# Patient Record
Sex: Male | Born: 1950 | Race: White | Hispanic: No | Marital: Married | Smoking: Current every day smoker
Health system: Southern US, Community
[De-identification: ages and names within clinical notes are randomized; demographics above are authoritative.]

## PROBLEM LIST (undated history)

## (undated) DIAGNOSIS — H9319 Tinnitus, unspecified ear: Secondary | ICD-10-CM

## (undated) DIAGNOSIS — K5900 Constipation, unspecified: Secondary | ICD-10-CM

## (undated) DIAGNOSIS — R251 Tremor, unspecified: Secondary | ICD-10-CM

## (undated) DIAGNOSIS — R531 Weakness: Secondary | ICD-10-CM

## (undated) DIAGNOSIS — R479 Unspecified speech disturbances: Secondary | ICD-10-CM

## (undated) DIAGNOSIS — M25569 Pain in unspecified knee: Secondary | ICD-10-CM

## (undated) DIAGNOSIS — R269 Unspecified abnormalities of gait and mobility: Secondary | ICD-10-CM

## (undated) HISTORY — DX: Pain in unspecified knee: M25.569

## (undated) HISTORY — DX: Tremor, unspecified: R25.1

## (undated) HISTORY — PX: REPLACEMENT TOTAL KNEE: SUR1224

## (undated) HISTORY — PX: COLONOSCOPY: SHX174

## (undated) HISTORY — DX: Unspecified speech disturbances: R47.9

## (undated) HISTORY — DX: Weakness: R53.1

## (undated) HISTORY — DX: Tinnitus, unspecified ear: H93.19

## (undated) HISTORY — DX: Constipation, unspecified: K59.00

## (undated) HISTORY — DX: Unspecified abnormalities of gait and mobility: R26.9

---

## 2020-09-13 ENCOUNTER — Ambulatory Visit: Payer: Medicare Other | Admitting: Neurology

## 2020-09-13 ENCOUNTER — Telehealth: Payer: Self-pay | Admitting: Neurology

## 2020-09-13 ENCOUNTER — Encounter: Payer: Self-pay | Admitting: Neurology

## 2020-09-13 VITALS — BP 128/68 | HR 76 | Ht 70.0 in | Wt 207.5 lb

## 2020-09-13 DIAGNOSIS — R251 Tremor, unspecified: Secondary | ICD-10-CM

## 2020-09-13 DIAGNOSIS — R269 Unspecified abnormalities of gait and mobility: Secondary | ICD-10-CM | POA: Diagnosis not present

## 2020-09-13 NOTE — Telephone Encounter (Signed)
[employer] medicare order sent to GI. No auth they will reach out to the patient to schedule.  

## 2020-09-13 NOTE — Progress Notes (Signed)
Chief Complaint  Patient presents with  . New Patient (Initial Visit)    He is here with his wife, Robert Heath. Reports right hand tremor, decreased grip strength, loss of muscle control, changes in his gait, speech problems and fatigue. He tried propranol 40mg , one BID without much change in tremor. He also tried on Sinemet 25-100mg , one tablet BID and did not notice much change. His symptoms started in September of 2020. States he was in a normal state of health prior to that time.   02-09-1969, Robert Heath, [place] - referring provider  . PCP    He goes to [facility] in [place], [place] for his primary care needs.  . Neurology    Previous seen by Dr. [place]    HISTORICAL  Robert Heath is a 69 year old male, seen in request by her chiropractor 74, for evaluation of tremor, initial evaluation was on September 13, 2020.  I reviewed and summarized the referring note.   He retired from September 15, 2020 at age 41, had left knee replacement surgery on June 06, 2019, recovered well  His wife reported gradual onset change since 2020, decline in functional status, gait abnormality, right hand tremor, he now rely on people's help to put on clothes and shoes, continue to decline rapidly, also has intermittent confusion, temper outbursts.  He was seen outside physician for right hand action tremor, was giving a trial of Sinemet 25/100 mg twice daily and propanolol 40 mg twice daily without benefit.   REVIEW OF SYSTEMS: Full 14 system review of systems performed and notable only for as above All other review of systems were negative.  ALLERGIES: Allergies  Allergen Reactions  . Codeine Other (See Comments)    GI upset    HOME MEDICATIONS: No current outpatient medications on file.   No current facility-administered medications for this visit.    PAST MEDICAL HISTORY: Past Medical History:  Diagnosis Date  . Constipation   . Gait abnormality   . Knee pain   .  Speech abnormality   . Tinnitus   . Tremor   . Weakness     PAST SURGICAL HISTORY: Past Surgical History:  Procedure Laterality Date  . COLONOSCOPY    . REPLACEMENT TOTAL KNEE Left     FAMILY HISTORY: Family History  Problem Relation Age of Onset  . Lung cancer Mother   . Other Father        died from COVID-19  . Hypertension Father   . Prostate cancer Father     SOCIAL HISTORY: Social History   Socioeconomic History  . Marital status: Married    Spouse name: Not on file  . Number of children: 2  . Years of education: 47  . Highest education level: High school graduate  Occupational History  . Occupation: Retired 14     Comment: [employer]  Tobacco Use  . Smoking status: Current Every Day Smoker    Packs/day: 0.33    Types: Cigarettes  . Smokeless tobacco: Never Used  Substance and Sexual Activity  . Alcohol use: Not Currently  . Drug use: Never  . Sexual activity: Not on file  Other Topics Concern  . Not on file  Social History Narrative   Lives at home with his wife.   Right-handed.   Caffeine use: 3-4 cups of tea per day.   Social Determinants of Health   Financial Resource Strain:   . Difficulty of Paying Living Expenses: Not  on file  Food Insecurity:   . Worried About Programme researcher, broadcasting/film/video in the Last Year: Not on file  . Ran Out of Food in the Last Year: Not on file  Transportation Needs:   . Lack of Transportation (Medical): Not on file  . Lack of Transportation (Non-Medical): Not on file  Physical Activity:   . Days of Exercise per Week: Not on file  . Minutes of Exercise per Session: Not on file  Stress:   . Feeling of Stress : Not on file  Social Connections:   . Frequency of Communication with Friends and Family: Not on file  . Frequency of Social Gatherings with Friends and Family: Not on file  . Attends Religious Services: Not on file  . Active Member of Clubs or Organizations: Not on file  . Attends Tax inspector Meetings: Not on file  . Marital Status: Not on file  Intimate Partner Violence:   . Fear of Current or Ex-Partner: Not on file  . Emotionally Abused: Not on file  . Physically Abused: Not on file  . Sexually Abused: Not on file     PHYSICAL EXAM   Vitals:   09/13/20 1426  BP: 128/68  Pulse: 76  Weight: 207 lb 8 oz (94.1 kg)  Height: 5\' 10"  (1.778 m)   Not recorded     Body mass index is 29.77 kg/m.  PHYSICAL EXAMNIATION:  Gen: NAD, conversant, well nourised, well groomed                     Cardiovascular: Regular rate rhythm, no peripheral edema, warm, nontender. Eyes: Conjunctivae clear without exudates or hemorrhage Neck: Supple, no carotid bruits. Pulmonary: Clear to auscultation bilaterally   NEUROLOGICAL EXAM:  MENTAL STATUS: MMSE - Mini Mental State Exam 09/13/2020  Orientation to time 5  Orientation to Place 3  Registration 3  Attention/ Calculation 0  Recall 0  Language- name 2 objects 2  Language- repeat 1  Language- follow 3 step command 3  Language- read & follow direction 1  Write a sentence 0  Copy design 0  Total score 18   Confused, follows commands intermittently, mild slurred speech, difficulty following 3 steps command   CRANIAL NERVES: CN II: Visual fields are full to confrontation.  Small pupils  are round equal and briskly reactive to light. CN III, IV, VI: extraocular movement are normal. No ptosis. CN V: Facial sensation is intact to light touch CN VII: Face is symmetric with normal eye closure  CN VIII: Hearing is normal to causal conversation. CN IX, X: Phonation is normal. CN XI: Head turning and shoulder shrug are intact  MOTOR: Right arm intermittent action tremor, pronation drift, tends to hold right arm in elbow flexion, shoulder adduction, move left arm, bilateral lower extremity without much difficulty  REFLEXES: Reflexes are 2+ and symmetric at the biceps, triceps, knees, and ankles. Plantar responses are  extensor bilaterally  SENSORY: Intact to light touch   COORDINATION: Seems to have right finger-to-nose dysmetria, was not able to follow commands for heel-to-shin  GAIT/STANCE: Needs to push-up to get up from seated position, wide-based, unsteady  DIAGNOSTIC DATA (LABS, IMAGING, TESTING) - I reviewed patient records, labs, notes, testing and imaging myself where available.   ASSESSMENT AND PLAN  Robert Heath is a 69 y.o. male   Gradual onset rapid progressive decline in functional status, including right hand action tremor, gait abnormality, mental confusion,  Mini-Mental Status Examination 18 out  of 30 today  Also had significant gait abnormality, mental confusion, difficulty following three-step commands,  Localized to central nervous system,  Proceed with MRI of the brain, EEG,  Laboratory evaluations,  Return to clinic in 3 to 4 weeks  Levert Feinstein, M.D. Ph.D.  [facility] [address], [address] [place], [place] [address] Ph: [phone] Fax: [phone]  CC:  Madlyn Frankel, DC [address] [place],  [place] [address]

## 2020-09-14 LAB — COMPREHENSIVE METABOLIC PANEL
ALT: 12 IU/L (ref 0–44)
AST: 14 IU/L (ref 0–40)
Albumin/Globulin Ratio: 1.6 (ref 1.2–2.2)
Albumin: 4.5 g/dL (ref 3.8–4.8)
Alkaline Phosphatase: 63 IU/L (ref 44–121)
BUN/Creatinine Ratio: 12 (ref 10–24)
BUN: 15 mg/dL (ref 8–27)
Bilirubin Total: 0.2 mg/dL (ref 0.0–1.2)
CO2: 21 mmol/L (ref 20–29)
Calcium: 9.8 mg/dL (ref 8.6–10.2)
Chloride: 101 mmol/L (ref 96–106)
Creatinine, Ser: 1.21 mg/dL (ref 0.76–1.27)
GFR calc Af Amer: 71 mL/min/{1.73_m2} (ref >59)
GFR calc non Af Amer: 61 mL/min/{1.73_m2} (ref >59)
Globulin, Total: 2.8 g/dL (ref 1.5–4.5)
Glucose: 94 mg/dL (ref 65–99)
Potassium: 4.2 mmol/L (ref 3.5–5.2)
Sodium: 139 mmol/L (ref 134–144)
Total Protein: 7.3 g/dL (ref 6.0–8.5)

## 2020-09-14 LAB — CBC WITH DIFFERENTIAL/PLATELET
Basophils Absolute: 0 10*3/uL (ref 0.0–0.2)
Basos: 1 %
EOS (ABSOLUTE): 0.1 10*3/uL (ref 0.0–0.4)
Eos: 1 %
Hematocrit: 47.8 % (ref 37.5–51.0)
Hemoglobin: 15.7 g/dL (ref 13.0–17.7)
Immature Grans (Abs): 0 10*3/uL (ref 0.0–0.1)
Immature Granulocytes: 0 %
Lymphocytes Absolute: 2.7 10*3/uL (ref 0.7–3.1)
Lymphs: 32 %
MCH: 30 pg (ref 26.6–33.0)
MCHC: 32.8 g/dL (ref 31.5–35.7)
MCV: 91 fL (ref 79–97)
Monocytes Absolute: 0.6 10*3/uL (ref 0.1–0.9)
Monocytes: 7 %
Neutrophils Absolute: 4.9 10*3/uL (ref 1.4–7.0)
Neutrophils: 59 %
Platelets: 382 10*3/uL (ref 150–450)
RBC: 5.24 x10E6/uL (ref 4.14–5.80)
RDW: 12.8 % (ref 11.6–15.4)
WBC: 8.4 10*3/uL (ref 3.4–10.8)

## 2020-09-14 LAB — C-REACTIVE PROTEIN: CRP: 2 mg/L (ref 0–10)

## 2020-09-14 LAB — SEDIMENTATION RATE: Sed Rate: 5 mm/hr (ref 0–30)

## 2020-09-14 LAB — FOLATE: Folate: >20 ng/mL (ref >3.0)

## 2020-09-14 LAB — HGB A1C W/O EAG: Hgb A1c MFr Bld: 5.6 % (ref 4.8–5.6)

## 2020-09-14 LAB — VITAMIN B12: Vitamin B-12: 461 pg/mL (ref 232–1245)

## 2020-09-14 LAB — HIV ANTIBODY (ROUTINE TESTING W REFLEX): HIV Screen 4th Generation wRfx: NONREACTIVE

## 2020-09-14 LAB — VITAMIN D 25 HYDROXY (VIT D DEFICIENCY, FRACTURES): Vit D, 25-Hydroxy: 28.1 ng/mL — ABNORMAL LOW (ref 30.0–100.0)

## 2020-09-14 LAB — RPR: RPR Ser Ql: NONREACTIVE

## 2020-09-14 LAB — CK: Total CK: 56 U/L (ref 41–331)

## 2020-09-14 LAB — ANA W/REFLEX IF POSITIVE: Anti Nuclear Antibody (ANA): NEGATIVE

## 2020-09-14 LAB — TSH: TSH: 1.13 u[IU]/mL (ref 0.450–4.500)

## 2020-09-17 ENCOUNTER — Telehealth: Payer: Self-pay | Admitting: *Deleted

## 2020-09-17 NOTE — Telephone Encounter (Signed)
-----   Message from Levert Feinstein, MD sent at 09/14/2020  4:00 PM EDT ----- Please call patient the only abnormality on laboratory evaluation was mildly decreased vitamin D 28, she would benefit vitamin D3 supplements,  Rest of the laboratory evaluation showed no significant abnormalities.

## 2020-09-17 NOTE — Telephone Encounter (Signed)
I spoke to the patient and he verbalized understanding of the results. He is agreeable to start the recommended supplement.

## 2020-09-24 ENCOUNTER — Ambulatory Visit: Payer: Medicare Other | Admitting: Neurology

## 2020-09-24 DIAGNOSIS — R251 Tremor, unspecified: Secondary | ICD-10-CM | POA: Diagnosis not present

## 2020-09-24 DIAGNOSIS — R269 Unspecified abnormalities of gait and mobility: Secondary | ICD-10-CM

## 2020-09-27 ENCOUNTER — Telehealth: Payer: Self-pay | Admitting: *Deleted

## 2020-09-27 NOTE — Procedures (Signed)
   HISTORY: 69 year old male presented with tremor loss of muscle tone,  TECHNIQUE:  This is a routine 16 channel EEG recording with one channel devoted to a limited EKG recording.  It was performed during wakefulness, drowsiness and asleep.  Hyperventilation and photic stimulation were performed as activating procedures.  There are minimum muscle and movement artifact noted.  Upon maximum arousal, posterior dominant waking rhythm consistent of rhythmic alpha range activity, with frequency of 9 hz. Activities are symmetric over the bilateral posterior derivations and attenuated with eye opening.  Hyperventilation produced mild/moderate buildup with higher amplitude and the slower activities noted.  Photic stimulation did not alter the tracing.  During EEG recording, patient developed drowsiness and entered sleep, sleep EEG demonstrated architecture, there were frontal centrally dominant vertex waves and symmetric sleep spindles noted.  During EEG recording, there was no epileptiform discharge noted.  EKG demonstrate sinus rhythm, with heart rate of 72 bpm  CONCLUSION: This is a  normal awake and sleep EEG.  There is no electrodiagnostic evidence of epileptiform discharge.  Levert Feinstein, M.D. Ph.D.  [facility] [address] [place], [place] [address] Phone: [phone] Fax:      [phone]

## 2020-09-27 NOTE — Telephone Encounter (Signed)
CONCLUSION:  This is a normal awake and sleep EEG. There is no  electrodiagnostic evidence of epileptiform discharge.  Per vo by Dr. Terrace Arabia, please notify patient of his results.  I spoke to Robert Heath and he verbalized understanding of the findings.

## 2020-09-27 NOTE — Telephone Encounter (Signed)
-----   Message from Lilla Shook, RN sent at 09/27/2020  5:18 PM EDT -----  ----- Message ----- From: Levert Feinstein, MD Sent: 09/27/2020   4:38 PM EDT To: Levert Feinstein, MD

## 2020-09-28 ENCOUNTER — Other Ambulatory Visit: Payer: Medicare Other

## 2020-10-08 ENCOUNTER — Ambulatory Visit
Admission: RE | Admit: 2020-10-08 | Discharge: 2020-10-08 | Disposition: A | Discharge status: Home / Self Care | Payer: Medicare Other | Source: Ambulatory Visit | Attending: Neurology | Admitting: Neurology

## 2020-10-08 ENCOUNTER — Other Ambulatory Visit: Payer: Self-pay

## 2020-10-08 DIAGNOSIS — R251 Tremor, unspecified: Secondary | ICD-10-CM

## 2020-10-08 DIAGNOSIS — R269 Unspecified abnormalities of gait and mobility: Secondary | ICD-10-CM

## 2020-10-09 ENCOUNTER — Telehealth: Payer: Self-pay | Admitting: Neurology

## 2020-10-09 NOTE — Telephone Encounter (Signed)
  IMPRESSION: This MRI of the brain without contrast shows the following: 1.   Mild generalized cortical atrophy 2.   Few scattered T2/FLAIR hyperintense foci consistent with minimal chronic microvascular ischemic change.  None of the foci appear to be acute. 3.   No acute findings.  Please call patient, MRI of the brain showed generalized atrophy, mild supratentorium small vessel disease, there was no acute abnormalities

## 2020-10-09 NOTE — Telephone Encounter (Signed)
I spoke to the patient. He verbalized understanding of the MRI findings.  ?

## 2020-10-09 NOTE — Telephone Encounter (Signed)
Left message requesting a return call.

## 2020-10-10 ENCOUNTER — Telehealth: Payer: Self-pay | Admitting: Neurology

## 2020-10-10 NOTE — Telephone Encounter (Signed)
Wife(on DPR-Geist, vivian) states all test results came back unremarkable, wife asking now about next plan of care, please call

## 2020-10-10 NOTE — Telephone Encounter (Signed)
I returned the call to the patient's wife. He was evaluated on 09/13/20. Orders were placed for EEG, MRI brain and labs. All tests have been completed. She wanted to schedule a follow up to discuss further steps. She scheduled the visit on 11/12/20.

## 2020-11-12 ENCOUNTER — Ambulatory Visit: Payer: Medicare Other | Admitting: Neurology

## 2020-11-15 ENCOUNTER — Encounter: Payer: Self-pay | Admitting: Neurology

## 2020-11-15 ENCOUNTER — Ambulatory Visit
Admission: RE | Admit: 2020-11-15 | Discharge: 2020-11-15 | Disposition: A | Discharge status: Home / Self Care | Payer: Medicare Other | Source: Ambulatory Visit | Attending: Neurology | Admitting: Neurology

## 2020-11-15 ENCOUNTER — Other Ambulatory Visit: Payer: Self-pay | Admitting: Neurology

## 2020-11-15 ENCOUNTER — Ambulatory Visit: Payer: Medicare Other | Admitting: Neurology

## 2020-11-15 ENCOUNTER — Other Ambulatory Visit: Payer: Self-pay

## 2020-11-15 VITALS — BP 118/74 | HR 85 | Ht 70.0 in | Wt 210.5 lb

## 2020-11-15 DIAGNOSIS — R410 Disorientation, unspecified: Secondary | ICD-10-CM

## 2020-11-15 DIAGNOSIS — R269 Unspecified abnormalities of gait and mobility: Secondary | ICD-10-CM

## 2020-11-15 DIAGNOSIS — R251 Tremor, unspecified: Secondary | ICD-10-CM

## 2020-11-15 MED ORDER — CARBIDOPA-LEVODOPA 25-100 MG PO TABS: 1.0000 | ORAL_TABLET | Freq: Three times a day (TID) | ORAL | 11 refills | Status: DC

## 2020-11-15 MED ORDER — QUETIAPINE FUMARATE 50 MG PO TABS: 100.0000 mg | ORAL_TABLET | Freq: Every day | ORAL | 11 refills | Status: DC

## 2020-11-15 NOTE — Progress Notes (Signed)
Chief Complaint  Patient presents with  . Abnormal Gait/Tremor    He is here with his wife, [name]. They would like to review results (labs, EEG, MRI brain) and next steps.   HISTORICAL  Robert Heath is a 69 year old male, seen in request by her chiropractor Lonn Georgia, for evaluation of tremor, initial evaluation was on September 13, 2020.  I reviewed and summarized the referring note.   He retired from Scientist, physiological at age 67, had left knee replacement surgery on June 06, 2019, recovered well  His wife reported gradual onset change since 2020, decline in functional status, gait abnormality, right hand tremor, he now rely on people's help to put on clothes and shoes, continue to decline rapidly, also has intermittent confusion, temper outbursts.  He was seen outside physician for right hand action tremor, was giving a trial of Sinemet 25/100 mg twice daily and propanolol 40 mg twice daily without benefit.  UPDATE Nov 15 2020: Personally reviewed MRI of the brain without contrast October 08, 2020: Mild generalized atrophy, mild supratentorium small vessel disease, there was no acute abnormalities.  Laboratory evaluations in October 2021: Normal or negative CMP, CBC, ANA, ESR, C-reactive protein, A1c, CPK, TSH, HIV, folic acid, X09, RPR, vitamin D level was 28  EEG was normal in October 2021  He continue complains of significant difficulty, mental confusion, difficulty using his arms, agitation, difficulty sleeping at nighttime, worsening memory loss, Mini-Mental Status Examination was 14 out of 30  REVIEW OF SYSTEMS: Full 14 system review of systems performed and notable only for as above All other review of systems were negative.  ALLERGIES: Allergies  Allergen Reactions  . Codeine Other (See Comments)    GI upset    HOME MEDICATIONS: Current Outpatient Medications  Medication Sig Dispense Refill  . Cholecalciferol (VITAMIN D3 PO) Take 1,000 Units by mouth daily.    .  tamsulosin (FLOMAX) 0.4 MG CAPS capsule Take 0.4 mg by mouth daily.     No current facility-administered medications for this visit.    PAST MEDICAL HISTORY: Past Medical History:  Diagnosis Date  . Constipation   . Gait abnormality   . Knee pain   . Speech abnormality   . Tinnitus   . Tremor   . Weakness     PAST SURGICAL HISTORY: Past Surgical History:  Procedure Laterality Date  . COLONOSCOPY    . REPLACEMENT TOTAL KNEE Left     FAMILY HISTORY: Family History  Problem Relation Age of Onset  . Lung cancer Mother   . Other Father        died from COVID-45  . Hypertension Father   . Prostate cancer Father     SOCIAL HISTORY: Social History   Socioeconomic History  . Marital status: Married    Spouse name: Not on file  . Number of children: 2  . Years of education: 45  . Highest education level: High school graduate  Occupational History  . Occupation: Retired Armed forces operational officer     Comment: [employer]  Tobacco Use  . Smoking status: Current Every Day Smoker    Packs/day: 0.33    Types: Cigarettes  . Smokeless tobacco: Never Used  Substance and Sexual Activity  . Alcohol use: Not Currently  . Drug use: Never  . Sexual activity: Not on file  Other Topics Concern  . Not on file  Social History Narrative   Lives at home with his wife.   Right-handed.   Caffeine use: 3-4  cups of tea per day.   Social Determinants of Health   Financial Resource Strain: Not on file  Food Insecurity: Not on file  Transportation Needs: Not on file  Physical Activity: Not on file  Stress: Not on file  Social Connections: Not on file  Intimate Partner Violence: Not on file     PHYSICAL EXAM   Vitals:   11/15/20 1252  BP: 118/74  Pulse: 85  Weight: 210 lb 8 oz (95.5 kg)  Height: 5' 10"  (1.778 m)   Not recorded     Body mass index is 30.2 kg/m.  PHYSICAL EXAMNIATION:  Gen: NAD, conversant, well nourised, well groomed                     Cardiovascular:  Regular rate rhythm, no peripheral edema, warm, nontender. Eyes: Conjunctivae clear without exudates or hemorrhage Neck: Supple, no carotid bruits. Pulmonary: Clear to auscultation bilaterally   NEUROLOGICAL EXAM:  MENTAL STATUS: Confused, follows commands intermittently, mild slurred speech, difficulty following 3 steps command  MMSE - Mini Mental State Exam 11/15/2020 09/13/2020  Orientation to time 5 5  Orientation to Place 2 3  Registration 3 3  Attention/ Calculation 0 0  Recall 0 0  Language- name 2 objects 2 2  Language- repeat 1 1  Language- follow 3 step command 0 3  Language- read & follow direction 1 1  Write a sentence 0 0  Copy design 0 0  Total score 14 18    CRANIAL NERVES: CN II: Visual fields are full to confrontation.  Small pupils  are round equal and briskly reactive to light. CN III, IV, VI: extraocular movement are normal. No ptosis. CN V: Facial sensation is intact to light touch CN VII: Face is symmetric with normal eye closure  CN VIII: Hearing is normal to causal conversation. CN IX, X: Phonation is normal. CN XI: Head turning and shoulder shrug are intact  MOTOR: Right arm intermittent action tremor, pronation drift, tends to hold right arm in elbow flexion, shoulder adduction, move left arm, bilateral lower extremity without much difficulty  REFLEXES: Reflexes are 2+ and symmetric at the biceps, triceps, knees, and ankles. Plantar responses are extensor bilaterally  SENSORY: Intact to light touch   COORDINATION: Seems to have right finger-to-nose dysmetria, was not able to follow commands for heel-to-shin  GAIT/STANCE: Needs to push-up to get up from seated position, wide-based, unsteady  DIAGNOSTIC DATA (LABS, IMAGING, TESTING) - I reviewed patient records, labs, notes, testing and imaging myself where available.   ASSESSMENT AND PLAN  Robert Heath is a 69 y.o. male   Gradual onset rapid progressive decline in functional status,  including right hand action tremor, gait abnormality, mental confusion,  Mini-Mental Status Examination continue to decline, 14/30  Presented with rapid progressive significant gait abnormality, mental confusion, difficulty following commands  MRI of the brain showed no significant abnormality, laboratory evaluation showed no treatable etiology, EEG was normal,  Unsure etiology, differentiation diagnosis including paraneoplastic syndrome, versus CJD  Chest x-ray  Lumbar puncture, including CSF 14-3-3, Tau protein,  Referral to [facility] neurology for further evaluation  Empirically treat with Sinemet 25/100 mg 3 times a day  Seroquel 50 mg titrating to 100 mg for nighttime agitations, difficulty sleeping  Marcial Pacas, M.D. Ph.D.  [facility] [address] [place], [place] [address] Ph: [phone] Fax: [phone]  CC:  Lonn Georgia, [place] [address]

## 2020-11-19 ENCOUNTER — Telehealth: Payer: Self-pay | Admitting: Neurology

## 2020-11-19 NOTE — Telephone Encounter (Signed)
Left patient a detailed message, with results, on voicemail (ok per DPR).  Provided our number to call back with any questions.  

## 2020-11-19 NOTE — Telephone Encounter (Signed)
  IMPRESSION: No focal airspace disease.  Emphysematous changes.   Please call patient, chest x-ray showed emphysema, no acute abnormality

## 2020-11-20 ENCOUNTER — Telehealth: Payer: Self-pay | Admitting: Neurology

## 2020-11-20 NOTE — Telephone Encounter (Signed)
I spoke to the patient's wife who reports he has been taking Seroquel 50mg , two tablets at bedtime. He has been able to sleep all night but feels excessively tired the following morning. She is going to try only one tablet at bedtime. Hopefully, this will still allow him to sleep and resolve his drowsiness upon waking.

## 2020-11-20 NOTE — Telephone Encounter (Signed)
I called his wife again to let her know that Seroquel 25mg  was an option. She will go ahead and try the Seroquel 50mg , one tablet at bedtime, since she already has it at home. If he still feels the same, then she will call back.

## 2020-11-20 NOTE — Telephone Encounter (Signed)
Wife(on DPR-Stipes, vivian) states QUEtiapine (SEROQUEL) 50 MG tablet makes pt groggy and she wants to know if its ok that he not takes the medication, please call.

## 2020-11-20 NOTE — Telephone Encounter (Signed)
It is okay to decrease Seroquel to 50 mg every night  If his wife to sign, we may call in Seroquel 25 mg tablets for better dosing

## 2020-11-23 ENCOUNTER — Other Ambulatory Visit: Payer: Self-pay

## 2020-11-23 ENCOUNTER — Ambulatory Visit
Admission: RE | Admit: 2020-11-23 | Discharge: 2020-11-23 | Disposition: A | Discharge status: Home / Self Care | Payer: Medicare Other | Source: Ambulatory Visit | Attending: Neurology | Admitting: Neurology

## 2020-11-23 DIAGNOSIS — R251 Tremor, unspecified: Secondary | ICD-10-CM

## 2020-11-23 DIAGNOSIS — R269 Unspecified abnormalities of gait and mobility: Secondary | ICD-10-CM

## 2020-11-23 DIAGNOSIS — R410 Disorientation, unspecified: Secondary | ICD-10-CM

## 2020-11-23 NOTE — Discharge Instructions (Signed)

## 2020-11-23 NOTE — Progress Notes (Signed)
27ml blood sample drawn from left a/c for labs to go with [facility] labs.

## 2020-12-06 ENCOUNTER — Telehealth: Payer: Self-pay | Admitting: *Deleted

## 2020-12-06 NOTE — Progress Notes (Signed)
One of the proteins in his spinal fluid came back positive which is abnormal.  This result has to be interpreted with the other test results which are in part pending and Dr. Terrace Arabia can update when all the results are in, hopefully next week.  Please update patient in this regard.

## 2020-12-06 NOTE — Telephone Encounter (Signed)
I have called the patient and left a message with this update.

## 2020-12-06 NOTE — Telephone Encounter (Signed)
Left patient a detailed message, with results, on voicemail (ok per DPR).  Provided our number to call back with any questions.  

## 2020-12-06 NOTE — Progress Notes (Signed)
So far most test results including test results on the spinal fluid are benign, several test results are pending and we will have to update when they are back.  Please call patient.

## 2020-12-06 NOTE — Telephone Encounter (Signed)
-----   Message from Huston Foley, MD sent at 12/06/2020 12:21 PM EST ----- One of the proteins in his spinal fluid came back positive which is abnormal.  This result has to be interpreted with the other test results which are in part pending and Dr. Terrace Arabia can update when all the results are in, hopefully next week.  Please update patient in this regard.

## 2020-12-06 NOTE — Telephone Encounter (Signed)
-----   Message from Huston Foley, MD sent at 12/06/2020 12:09 PM EST ----- So far most test results including test results on the spinal fluid are benign, several test results are pending and we will have to update when they are back.  Please call patient.

## 2020-12-25 ENCOUNTER — Telehealth: Payer: Self-pay | Admitting: Neurology

## 2020-12-25 NOTE — Telephone Encounter (Signed)
I talked with his wife, he continues slow worsening over the past few weeks,  Spinal fluid testing showed no significant abnormality, other than mildly elevated CSF neuron specific Enolase, indicate brain damage, but no clear etiology found by CSF study  He was started on Sinemet 25/100 mg 3 times a day, which did not help his movement, he is taking Seroquel 50 mg half tablet every night, which has helped him sleep, higher dose would cause drowsiness next day  He has not heard anything from [facility] yet, please check on his appointment with [facility] neurology, make sure he is on schedule for [facility] neurology

## 2020-12-27 ENCOUNTER — Telehealth: Payer: Self-pay | Admitting: Neurology

## 2020-12-27 NOTE — Telephone Encounter (Signed)
Pt.'s wife [name] is on [place]. She states husband has not been able to get out of bed or move since last night. Please advise.

## 2020-12-27 NOTE — Telephone Encounter (Signed)
I returned the call to the patient's wife. She stated that he was able to get up shortly after her call to our office. He is now in the same physical state as his last evaluation with slow, steady progression of symptoms. She has not heard anything for the referral to [facility] yet and would like for our office to check on this for her. She was also offered an earlier appt here but declined.

## 2020-12-31 NOTE — Telephone Encounter (Signed)
Hello this Robert Heath I have Left you a message I have you apt scheduled  with [facility] 01/01/2021 arrive at 10:30 for 11:00 apt address is [address] [place] . If you cant Make  this apt you can call them at [phone] and reschedule . Thanks Dana   Left patient via my chart and voice mail .

## 2020-12-31 NOTE — Telephone Encounter (Signed)
Hello this Dana I have Left you a message I have you apt scheduled  with [facility] 01/01/2021 arrive at 10:30 for 11:00 apt address is [address] [place] . If you cant Make  this apt you can call them at [phone] and reschedule . Thanks Dana  ° °Left patient via my chart and voice mail .  °

## 2021-01-04 LAB — FUNGUS CULTURE W SMEAR
CULTURE:: NO GROWTH
MICRO NUMBER:: 11331908
SMEAR:: NONE SEEN
SPECIMEN QUALITY:: ADEQUATE

## 2021-01-04 LAB — MULTIPLE SCLEROSIS PANEL 2
Albumin Serum: 3.7 g/dL (ref 3.2–4.6)
Albumin, CSF: 24 mg/dL (ref 8.0–42.0)
CNS-IgG Synthesis Rate: -3.5 mg/24 h (ref <=3.3)
IgG (Immunoglobin G), Serum: 1130 mg/dL (ref 600–1540)
IgG Total CSF: 3.4 mg/dL (ref 0.8–7.7)
IgG-Index: 0.46 (ref <0.66)
Myelin Basic Protein: <2 mcg/L (ref 2.0–4.0)

## 2021-01-04 LAB — HERPES SIMPLEX VIRUS, TYPE 1 AND 2 DNA,QUAL,RT PCR
HSV 1 DNA: NOT DETECTED
HSV 2 DNA: NOT DETECTED

## 2021-01-04 LAB — GRAM STAIN
MICRO NUMBER:: 11331907
SPECIMEN QUALITY:: ADEQUATE

## 2021-01-04 LAB — ARUP MISC ORDER
Miscellaneous Test Results: 633
PRICE:: 200

## 2021-01-04 LAB — PROTEIN, CSF 14-3-3 (PRION DISEASE)
14-3-3 PROTEIN (CSF)++: 5090 AU/ml — ABNORMAL HIGH (ref 30–1999)
EST PROB PRION DIS IN PATIENT: <1 %
RT-QUIC (CSF)*: NEGATIVE
T-TAU PROTEIN (CSF)++: 740 pg/ml (ref 0–1149)

## 2021-01-04 LAB — CSF CELL COUNT WITH DIFFERENTIAL
RBC Count, CSF: 1 cells/uL — ABNORMAL HIGH
WBC, CSF: 0 cells/uL (ref 0–5)

## 2021-01-04 LAB — VDRL, CSF: VDRL Quant, CSF: NONREACTIVE

## 2021-01-04 LAB — NEURON SPECIFIC ENOLASE (NSE), CSF: Neuron Specific Enolase (NSE), CSF: 14.4 ng/mL — ABNORMAL HIGH (ref <8.9)

## 2021-01-04 LAB — ADMARK® PHOSPHO-TAU/TOTAL-TAU/A BETA42,CSF

## 2021-01-04 LAB — GLUCOSE, CSF: Glucose, CSF: 60 mg/dL (ref 40–80)

## 2021-01-04 LAB — PROTEIN, CSF: Total Protein, CSF: 48 mg/dL (ref 15–60)

## 2021-01-07 ENCOUNTER — Telehealth: Payer: Self-pay | Admitting: Neurology

## 2021-01-07 MED ORDER — QUETIAPINE FUMARATE 25 MG PO TABS: 50.0000 mg | ORAL_TABLET | Freq: Every day | ORAL | 6 refills | Status: AC

## 2021-01-07 NOTE — Addendum Note (Signed)
Addended by: Levert Feinstein on: 01/07/2021 12:23 PM   Modules accepted: Orders

## 2021-01-07 NOTE — Telephone Encounter (Signed)
I have talked with his wife over the phone, patient continued to decline significantly, Sinemet was not helpful, I have suggested her to stop Sinemet  He is now taking quetiapine 50 mg half tablets, it does help him sleep better, I have written 25 mg tablets, he may take one and half if needed  Please change his next visit to virtual visit

## 2021-01-07 NOTE — Telephone Encounter (Addendum)
Detection of 14-3-3 protein in the cerebrospinal fluid supports the diagnosis of Creutzfeldt-Jakob Robert Robert 1, Robert Robert, Robert Robert, Robert Robert, Robert Robert, Robert Robert, Robert Robert, Robert Robert expand PMID: [id number] DOI: 10.1002/ana.410430109 Abstract The analysis of 14-3-3 protein in cerebrospinal fluid (CSF) was shown to be highly sensitive and specific for the diagnosis of Creutzfeldt-Jakob Robert (CJD). However, the predictive value of this test in the clinical diagnosis of, and its relation to, sporadic, genetic, and iatrogenic CJD cases have yet to be established. CSF samples of suspect CJD cases seen in the prospective [place] surveillance study were tested for the presence of 14-3-3 protein by using a modified western blot (WB) technique. WB detected 14-3-3 protein in 95.4% of definite and 92.8% of probable cases. In two patients classified initially as not having CJD the test was positive, and both were later proved to have definite CJD. The positive predictive value is 94.7% and the negative predictive value is 92.4%. False-positive results in a single CSF analysis were seen in patients with herpes simplex encephalitis, hypoxic brain damage, atypical encephalitis, intracerebral metastases of a bronchial carcinoma, metabolic encephalopathy, and progressive dementia of unknown cause. WB analysis for 14-3-3 protein was positive in only 5 of 10 cases of familial forms of spongiform encephalopathies. CSF analysis for 14-3-3 protein should thus be performed in any case suspect for CJD.   file:///C:/[email or web address] Robert%20Yan%20Personal/Robert%[email or web address]  WHO Infection Control Guidelines for Transmissible Spongiform Encephalopathies Report of a WHO Consultation [place], [place], 23 -26 March 199 JS Annice Pih a Section 3. PATIENT CARE 3.1 Care of patients in the home and healthcare settings 3.1.1 Patient care Normal social and clinical  contact, and non-invasive clinical investigations (e.g. x-ray imaging procedures) with TSE patients do not present a risk to healthcare workers, relatives, or the community. There is no reason to defer, deny, or in any way discourage the admission of a person with a TSE into any healthcare setting. Based on current knowledge, isolation of patients is not necessary; they can be nursed in the open ward using Standard Precautions. As the Robert is usually rapidly progressive, the patient will develop high dependency needs and require ongoing assessment. It is essential to address the physical, nutritional, psychological, educational, and social needs of the patient and the associated needs of his or her family. Co-ordinated planning is vital in transferring care from one environment to another. Private room nursing care is not required for infection control, but may be appropriate for compassionate reasons. Patient waste should be handled according to country, regional or federal regulations. Contamination by body fluids (categorized as no detectable infectivity tissues) poses no greater hazard than for any other patient. No special precautions are required for feeding utensils, feeding tubes, suction tubes, bed  7  TSE can be experimentally transmitted to healthy animals by exposing abraded gingival tissue to infected brain homogenate. 8  By analogy with cornea transplants. 9  A documented route of transmission in humans, from contaminated human cadaver extracted pituitary hormones (hGH and gonadotropin). 10 Intraperitoneal, intramuscular and intravenous administration of low infectivity tissue extracts can cause transmission of TSE in experimental animals. 11 By analogy with transmissions following neurosurgical procedures. WHO/CDS/CSR/APH/2000.3 6 WHO Infection Control Guidelines for Transmissible Spongiform Encephalopathies linens, or items used in skin or bed sore care in the home  environment.2.4.3 Route of exposure When determining risk, infectivity of a tissue must be considered together with the route of exposure. Cutaneous exposure of intact skin or mucous membranes (except those of the eye)  poses negligible risk; however, it is prudent and highly recommended to avoid such exposure when working with any high infectivity tissue. Transcutaneous exposures, including contact exposures to non-intact skin or mucous membranes,7 splashes to the eye,8  and inoculations via needle9,10 or scalpel and other surgical instruments11 pose a greater potential risk. Thus, it is prudent to avoid these types of exposures when working with either low infectivity or high infectivity tissues. CNS exposures (i.e. inoculation of the eye or CNS) with any infectious material poses a very serious risk, and appropriate precautions must always be taken to avoid these kinds of Exposures   He had dental procedure on Jan 6th 2022, [facility] [address] [place], [place] [address]. [phone] His wife agrees our office to call his dental office to warn them the diagnosis. I talked with his dentist Dr. Modesto Charon, he is aware of the situatio  He continues to decline rapidly, can barely walk, can feed himself, he is more confused, difficulty speaking, easily agitated, his daughter is also involved in his care.

## 2021-02-19 ENCOUNTER — Telehealth (INDEPENDENT_AMBULATORY_CARE_PROVIDER_SITE_OTHER): Payer: Medicare Other | Admitting: Neurology

## 2021-02-19 DIAGNOSIS — F5104 Psychophysiologic insomnia: Secondary | ICD-10-CM | POA: Diagnosis not present

## 2021-02-19 DIAGNOSIS — A8100 Creutzfeldt-Jakob disease, unspecified: Secondary | ICD-10-CM | POA: Diagnosis not present

## 2021-02-19 NOTE — Progress Notes (Signed)
No chief complaint on file.  HISTORICAL  Robert Heath is a 70 year old male, seen in request by her chiropractor Lonn Georgia, for evaluation of tremor, initial evaluation was on September 13, 2020.  I reviewed and summarized the referring note.   He retired from Scientist, physiological at age 58, had left knee replacement surgery on June 06, 2019, recovered well  His wife reported gradual onset change since 2020, decline in functional status, gait abnormality, right hand tremor, he now rely on people's help to put on clothes and shoes, continue to decline rapidly, also has intermittent confusion, temper outbursts.  He was seen outside physician for right hand action tremor, was giving a trial of Sinemet 25/100 mg twice daily and propanolol 40 mg twice daily without benefit.  UPDATE Nov 15 2020: Personally reviewed MRI of the brain without contrast October 08, 2020: Mild generalized atrophy, mild supratentorium small vessel disease, there was no acute abnormalities.  Laboratory evaluations in October 2021: Normal or negative CMP, CBC, ANA, ESR, C-reactive protein, A1c, CPK, TSH, HIV, folic acid, T06, RPR, vitamin D level was 28  EEG was normal in October 2021  He continue complains of significant difficulty, mental confusion, difficulty using his arms, agitation, difficulty sleeping at nighttime, worsening memory loss, Mini-Mental Status Examination was 14 out of 30  Virtual Visit via Video  I connected with [name] on 02/19/21 at  by Video and verified that I am speaking with the correct person using two identifiers.   I discussed the limitations, risks, security and privacy concerns of performing an evaluation and management service by video and the availability of in person appointments. I also discussed with the patient that there may be a patient responsible charge related to this service. The patient expressed understanding and agreed to proceed.   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,  CSF: WBC 0, RBC 1, negative VDRL, Gram stain was negative, fungal growth was negative, protein was 48, glucose was 60, 14-3-3 protein was significantly elevated 5090 (with normal being less than 1999),  Literature reviewed, above abnormal CSF findings, along with this rapid decline essentially confirm the diagnosis of CJD, he lives at home with his wife, need assistant to take few steps, worsening bilateral hand tremor, dependent on finger food, wife reported that he sleeps well, denies swallowing difficulty, he was referred to [facility], wife decided to cancel appointment because of difficulty in transportation, she does not want PT/OT/ST at home either  Detection of 14-3-3 protein in the cerebrospinal fluid supports the diagnosis of Creutzfeldt-Jakob disease I Zerr 1, M Bodemer, O Gefeller, M Otto, S Poser, J [place], O Windl, Fara Chute expand PMID: [id number] DOI: 10.1002/ana.410430109 Abstract The analysis of 14-3-3 protein in cerebrospinal fluid (CSF) was shown to be highly sensitive and specific for the diagnosis of Creutzfeldt-Jakob disease (CJD). However, the predictive value of this test in the clinical diagnosis of, and its relation to, sporadic, genetic, and iatrogenic CJD cases have yet to be established. CSF samples of suspect CJD cases seen in the prospective [place] surveillance study were tested for the presence of 14-3-3 protein by using a modified western blot (WB) technique. WB detected 14-3-3 protein in 95.4% of definite and 92.8% of probable cases. In two patients classified initially as not having CJD the test was positive, and both were later proved to have definite CJD. The positive predictive value is 94.7% and the negative  predictive value is 92.4%. False-positive results in a single CSF analysis were seen in patients with herpes simplex encephalitis, hypoxic brain  damage, atypical encephalitis, intracerebral metastases of a bronchial carcinoma, metabolic encephalopathy, and progressive dementia of unknown cause. WB analysis for 14-3-3 protein was positive in only 5 of 10 cases of familial forms of spongiform encephalopathies. CSF analysis for 14-3-3 protein should thus be performed in any case suspect for CJD.   Observations/Objective: I have reviewed problem lists, medications, allergies.  Mild slow speech, sitting at the corner of sofa, awake, cooperative on examination, facial were symmetric, significant action tremor of right hand, barely antigravity movement, has more movement of left hand, dysmetria on left finger-to-nose, able to raise both leg against gravity,  Assessment and Plan: Sporadic CJD disease Insomnia  Confirmed by abnormal CSF result, significantly elevated 14-3-3 protein  Rapid clinical progression,  Continue home comfort care  Call clinic for new issues,  Improved with Seroquel 25 mg every night  Follow Up Instructions:   As Needed  Total time spent reviewing the chart, obtaining history, examined patient, ordering tests, documentation, consultations and family, care coordination was 45 minutes    Marcial Pacas, MD  REVIEW OF SYSTEMS: Full 14 system review of systems performed and notable only for as above All other review of systems were negative.  ALLERGIES: Allergies  Allergen Reactions  . Codeine Other (See Comments)    GI upset    HOME MEDICATIONS: Current Outpatient Medications  Medication Sig Dispense Refill  . Cholecalciferol (VITAMIN D3 PO) Take 1,000 Units by mouth daily.    . QUEtiapine (SEROQUEL) 25 MG tablet Take 2 tablets (50 mg total) by mouth at bedtime. 60 tablet 6  . tamsulosin (FLOMAX) 0.4 MG CAPS capsule Take 0.4 mg by mouth daily.     No current facility-administered medications for this visit.    PAST MEDICAL HISTORY: Past Medical History:  Diagnosis Date  . Constipation   . Gait  abnormality   . Knee pain   . Speech abnormality   . Tinnitus   . Tremor   . Weakness     PAST SURGICAL HISTORY: Past Surgical History:  Procedure Laterality Date  . COLONOSCOPY    . REPLACEMENT TOTAL KNEE Left     FAMILY HISTORY: Family History  Problem Relation Age of Onset  . Lung cancer Mother   . Other Father        died from COVID-27  . Hypertension Father   . Prostate cancer Father     SOCIAL HISTORY: Social History   Socioeconomic History  . Marital status: Married    Spouse name: Not on file  . Number of children: 2  . Years of education: 106  . Highest education level: High school graduate  Occupational History  . Occupation: Retired Armed forces operational officer     Comment: [employer]  Tobacco Use  . Smoking status: Current Every Day Smoker    Packs/day: 0.33    Types: Cigarettes  . Smokeless tobacco: Never Used  Substance and Sexual Activity  . Alcohol use: Not Currently  . Drug use: Never  . Sexual activity: Not on file  Other Topics Concern  . Not on file  Social History Narrative   Lives at home with his wife.   Right-handed.   Caffeine use: 3-4 cups of tea per day.   Social Determinants of Health   Financial Resource Strain: Not on file  Food Insecurity: Not on file  Transportation Needs: Not on file  Physical Activity: Not on file  Stress: Not on file  Social Connections: Not on file  Intimate Partner Violence: Not on file     PHYSICAL EXAM   There were no vitals filed for this visit. Not recorded     There is no height or weight on file to calculate BMI.  PHYSICAL EXAMNIATION:  Gen: NAD, conversant, well nourised, well groomed                     Cardiovascular: Regular rate rhythm, no peripheral edema, warm, nontender. Eyes: Conjunctivae clear without exudates or hemorrhage Neck: Supple, no carotid bruits. Pulmonary: Clear to auscultation bilaterally   NEUROLOGICAL EXAM:  MENTAL STATUS: Confused, follows commands  intermittently, mild slurred speech, difficulty following 3 steps command  MMSE - Mini Mental State Exam 11/15/2020 09/13/2020  Orientation to time 5 5  Orientation to Place 2 3  Registration 3 3  Attention/ Calculation 0 0  Recall 0 0  Language- name 2 objects 2 2  Language- repeat 1 1  Language- follow 3 step command 0 3  Language- read & follow direction 1 1  Write a sentence 0 0  Copy design 0 0  Total score 14 18    CRANIAL NERVES: CN II: Visual fields are full to confrontation.  Small pupils  are round equal and briskly reactive to light. CN III, IV, VI: extraocular movement are normal. No ptosis. CN V: Facial sensation is intact to light touch CN VII: Face is symmetric with normal eye closure  CN VIII: Hearing is normal to causal conversation. CN IX, X: Phonation is normal. CN XI: Head turning and shoulder shrug are intact  MOTOR: Right arm intermittent action tremor, pronation drift, tends to hold right arm in elbow flexion, shoulder adduction, move left arm, bilateral lower extremity without much difficulty  REFLEXES: Reflexes are 2+ and symmetric at the biceps, triceps, knees, and ankles. Plantar responses are extensor bilaterally  SENSORY: Intact to light touch   COORDINATION: Seems to have right finger-to-nose dysmetria, was not able to follow commands for heel-to-shin  GAIT/STANCE: Needs to push-up to get up from seated position, wide-based, unsteady  DIAGNOSTIC DATA (LABS, IMAGING, TESTING) - I reviewed patient records, labs, notes, testing and imaging myself where available.   ASSESSMENT AND PLAN  Stephfon Bovey is a 70 y.o. male   Gradual onset rapid progressive decline in functional status, including right hand action tremor, gait abnormality, mental confusion,  Mini-Mental Status Examination continue to decline, 14/30  Presented with rapid progressive significant gait abnormality, mental confusion, difficulty following commands  MRI of the brain showed  no significant abnormality, laboratory evaluation showed no treatable etiology, EEG was normal,  Unsure etiology, differentiation diagnosis including paraneoplastic syndrome, versus CJD  Chest x-ray  Lumbar puncture, including CSF 14-3-3, Tau protein,  Referral to [facility] neurology for further evaluation  Empirically treat with Sinemet 25/100 mg 3 times a day  Seroquel 50 mg titrating to 100 mg for nighttime agitations, difficulty sleeping  Marcial Pacas, M.D. Ph.D.  [facility] [address], [place], [place] [address] Ph: [phone] Fax: [phone]  CC:  Lonn Georgia, [place] [address]

## 2022-03-26 IMAGING — XA DG SPINAL PUNCT LUMBAR DIAG WITH FL CT GUIDANCE
1 series · 1 of 1 positions shown · non-contrast
Comparison: none

CLINICAL DATA: Rapid progressive dementia.  Gait abnormalities.

[Series 2: ortho adipose · 1 of 1 slices shown]
[im 1/1]
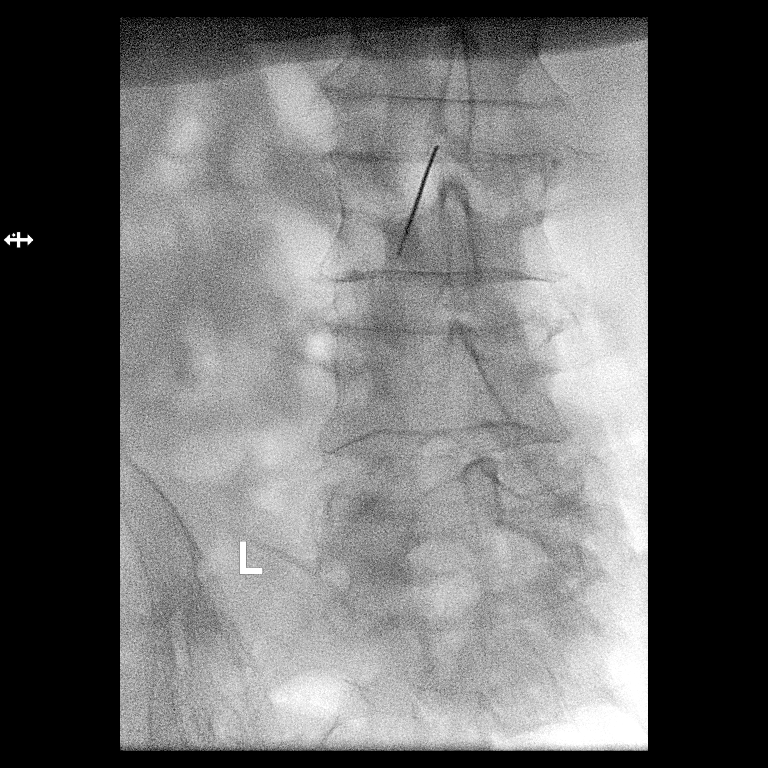

[1 of 1 positions shown; findings below may reference images not displayed]

EXAM:
DIAGNOSTIC LUMBAR PUNCTURE UNDER FLUOROSCOPIC GUIDANCE

FLUOROSCOPY TIME:  Fluoroscopy Time:

PROCEDURE:
Informed consent was obtained from the patient prior to the
procedure, including potential complications of headache, allergy,
and pain. With the patient in the left lateral decubitus position,
the lower back was prepped with Betadine. 1% Lidocaine was used for
local anesthesia. Lumbar puncture was performed at the L2-3 level
using a 20 gauge needle with return of clear CSF with an opening
pressure of 14.0 cm water. Seventeen ml of CSF were obtained for
laboratory studies. The patient tolerated the procedure well and
there were no apparent complications.
IMPRESSION: Technically successful fluoroscopic guided lumbar puncture via a
left paramedian approach at the L2-3 level. CSF samples were sent
for laboratory examination as requested.
